# Patient Record
Sex: Male | Born: 1975 | Hispanic: No | Marital: Single | Smoking: Current some day smoker
Health system: Southern US, Community
[De-identification: ages and names within clinical notes are randomized; demographics above are authoritative.]

## PROBLEM LIST (undated history)

## (undated) DIAGNOSIS — E7800 Pure hypercholesterolemia, unspecified: Secondary | ICD-10-CM

## (undated) DIAGNOSIS — J45909 Unspecified asthma, uncomplicated: Secondary | ICD-10-CM

## (undated) DIAGNOSIS — I10 Essential (primary) hypertension: Secondary | ICD-10-CM

## (undated) HISTORY — DX: Unspecified asthma, uncomplicated: J45.909

---

## 1999-07-07 ENCOUNTER — Encounter: Payer: Self-pay | Admitting: Emergency Medicine

## 1999-07-07 ENCOUNTER — Emergency Department (HOSPITAL_COMMUNITY): Admission: EM | Admit: 1999-07-07 | Discharge: 1999-07-07 | Payer: Self-pay | Admitting: Emergency Medicine

## 2000-05-24 ENCOUNTER — Emergency Department (HOSPITAL_COMMUNITY): Admission: EM | Admit: 2000-05-24 | Discharge: 2000-05-24 | Payer: Self-pay | Admitting: Emergency Medicine

## 2012-12-05 ENCOUNTER — Encounter: Payer: Self-pay | Admitting: *Deleted

## 2013-07-04 ENCOUNTER — Emergency Department (HOSPITAL_COMMUNITY)
Admission: EM | Admit: 2013-07-04 | Discharge: 2013-07-04 | Disposition: A | Discharge status: Home / Self Care | Payer: BC Managed Care – PPO | Attending: Emergency Medicine | Admitting: Emergency Medicine

## 2013-07-04 ENCOUNTER — Encounter (HOSPITAL_COMMUNITY): Payer: Self-pay | Admitting: Emergency Medicine

## 2013-07-04 DIAGNOSIS — S0083XA Contusion of other part of head, initial encounter: Secondary | ICD-10-CM | POA: Diagnosis not present

## 2013-07-04 DIAGNOSIS — Y9289 Other specified places as the place of occurrence of the external cause: Secondary | ICD-10-CM | POA: Insufficient documentation

## 2013-07-04 DIAGNOSIS — Y939 Activity, unspecified: Secondary | ICD-10-CM | POA: Insufficient documentation

## 2013-07-04 DIAGNOSIS — S1093XA Contusion of unspecified part of neck, initial encounter: Principal | ICD-10-CM

## 2013-07-04 DIAGNOSIS — R51 Headache: Secondary | ICD-10-CM | POA: Insufficient documentation

## 2013-07-04 DIAGNOSIS — T148XXA Other injury of unspecified body region, initial encounter: Secondary | ICD-10-CM

## 2013-07-04 DIAGNOSIS — I10 Essential (primary) hypertension: Secondary | ICD-10-CM | POA: Insufficient documentation

## 2013-07-04 DIAGNOSIS — E7800 Pure hypercholesterolemia, unspecified: Secondary | ICD-10-CM | POA: Diagnosis not present

## 2013-07-04 DIAGNOSIS — S0093XA Contusion of unspecified part of head, initial encounter: Secondary | ICD-10-CM

## 2013-07-04 DIAGNOSIS — F17200 Nicotine dependence, unspecified, uncomplicated: Secondary | ICD-10-CM | POA: Diagnosis not present

## 2013-07-04 DIAGNOSIS — IMO0002 Reserved for concepts with insufficient information to code with codable children: Secondary | ICD-10-CM | POA: Diagnosis not present

## 2013-07-04 DIAGNOSIS — S0990XA Unspecified injury of head, initial encounter: Secondary | ICD-10-CM | POA: Diagnosis present

## 2013-07-04 DIAGNOSIS — S0003XA Contusion of scalp, initial encounter: Secondary | ICD-10-CM | POA: Diagnosis not present

## 2013-07-04 DIAGNOSIS — R42 Dizziness and giddiness: Secondary | ICD-10-CM | POA: Insufficient documentation

## 2013-07-04 HISTORY — DX: Essential (primary) hypertension: I10

## 2013-07-04 HISTORY — DX: Pure hypercholesterolemia, unspecified: E78.00

## 2013-07-04 MED ORDER — IBUPROFEN 400 MG PO TABS
800.0000 mg | ORAL_TABLET | Freq: Once | ORAL | Status: DC
  Filled 2013-07-04: qty 2

## 2013-07-04 MED ORDER — HYDROCODONE-ACETAMINOPHEN 5-325 MG PO TABS: 1.0000 | ORAL_TABLET | ORAL | Status: DC | PRN

## 2013-07-04 MED ORDER — TETANUS-DIPHTH-ACELL PERTUSSIS 5-2.5-18.5 LF-MCG/0.5 IM SUSP
0.5000 mL | Freq: Once | INTRAMUSCULAR | Status: DC
  Filled 2013-07-04: qty 0.5

## 2013-07-04 MED ORDER — IBUPROFEN 800 MG PO TABS: 800.0000 mg | ORAL_TABLET | Freq: Three times a day (TID) | ORAL | Status: DC

## 2013-07-04 NOTE — ED Notes (Signed)
Pt reports today he got hit in the head with a wrench, while working. No LOC, no blood thinners. Denies any blurry vision or dizziness. Pt is a x 4

## 2013-07-04 NOTE — Discharge Instructions (Signed)
Call for a follow up appointment with a Family or Primary Care Provider.  Return to the Emergency Department if your symptoms worsen.   Take medication as prescribed.  Ice your head 3-4 times a day for 15 minutes. Use your pain medication as prescribed and do not operate heavy machinery while on pain medication. Note that your pain medication contains acetaminophen (Tylenol) & its is not reccommended that you use additional acetaminophen (Tylenol) while taking this medication.    Emergency Department Resource Guide 1) Find a Doctor and Pay Out of Pocket Although you won't have to find out who is covered by your insurance plan, it is a good idea to ask around and get recommendations. You will then need to call the office and see if the doctor you have chosen will accept you as a new patient and what types of options they offer for patients who are self-pay. Some doctors offer discounts or will set up payment plans for their patients who do not have insurance, but you will need to ask so you aren't surprised when you get to your appointment.  2) Contact Your Local Health Department Not all health departments have doctors that can see patients for sick visits, but many do, so it is worth a call to see if yours does. If you don't know where your local health department is, you can check in your phone book. The CDC also has a tool to help you locate your state's health department, and many state websites also have listings of all of their local health departments.  3) Find a Walk-in Clinic If your illness is not likely to be very severe or complicated, you may want to try a walk in clinic. These are popping up all over the country in pharmacies, drugstores, and shopping centers. They're usually staffed by nurse practitioners or physician assistants that have been trained to treat common illnesses and complaints. They're usually fairly quick and inexpensive. However, if you have serious medical issues or  chronic medical problems, these are probably not your best option.  No Primary Care Doctor: - Call Health Connect at  [phone] - they can help you locate a primary care doctor that  accepts your insurance, provides certain services, etc. - Physician Referral Service- [phone]  Chronic Pain Problems: Organization         Address  Phone   Notes  [facility]  [phone] Patients need to be referred by their primary care doctor.   Medication Assistance: Organization         Address  Phone   Notes  [facility] Medication Dr. [facility] [address] [place], [place] [address] [phone] --Must be a resident of [facility] [place] -- Must have NO insurance coverage whatsoever (no Medicaid/ Medicare, etc.) -- The pt. MUST have a primary care doctor that directs their care regularly and follows them in the community   MedAssist  [phone]   [employer]  [phone]) [phone]    Agencies that provide inexpensive medical care: Organization         Address  Phone   Notes  [facility]  [phone]   [facility]    [phone]   [facility] [address] [place], [place] [address] [phone]   [facility] [address], [place] [phone]   Planned Parenthood    [phone]) [phone]   [facility]    [phone]  [phone]   [address], [place] Phone:  [phone], Fax:  [phone] Hours of Operation:  9 am - 6 pm, M-F.  Also accepts Medicaid/Medicare and self-pay.  [facility] [place], [address], [place] Phone: [phone], Fax: [phone]. Hours of Operation:  8:30 am - 5:30 pm, M-F.  Also accepts Medicaid and self-pay.  [facility] [place] [address] [place] Phone: [phone]   [place], [place], [place]  [phone], Ext. [phone] Mondays & Thursdays: 7-9 AM.  First 15 patients are seen on a first come, first serve basis.    [place] Providers:  Organization         Address  Phone   Notes  [facility] [address], [place] [phone] Also accepts self-pay patients.  [facility] [address], [place]  [phone]   [place], [address], [place] [phone]   [facility] [address] [place] [phone]   Lucianne Lei [address], [place]   [phone] Only accepts [place] Access [place] patients after they have their name applied to their card.   Self-Pay (no insurance) in [facility]:  Organization         Address  Phone   Notes  Sickle Cell Patients, [facility] [place] [phone]   [facility] [place] [phone]   [facility] [place]  [place], [place], [place] [phone]   [facility]/Dr. Osei-Bonsu  [address], [place] or [address], [place] [phone] Phone number for both [place] and [place] locations is the same.  [facility] [address] [place] [phone]   [facility] [address] [place] or [address] [phone]   [facility] [address] [place] [phone], phone; [phone], fax Sees patients 1st and 3rd Saturday of every month.  Must not qualify for public or private insurance (i.e. Medicaid, Medicare, [place] Health Choice, Veterans' Benefits)  Household income should be no more than 200% of the poverty level The clinic cannot treat you if you are pregnant or think you are pregnant  Sexually transmitted  diseases are not treated at the clinic.    Dental Care: Organization         Address  Phone  Notes  [facility] [phone] Accepts children up to age 10 who are enrolled in [place] or [place]; pregnant women with a Medicaid card; and children who have applied for Medicaid or [place] Health Choice, but were declined, whose parents can pay a reduced fee at time of service.  [facility]  [address], [place] [phone] Accepts children up to age 22 who are enrolled in [place] or [place]; pregnant women with a Medicaid card; and children who have applied for Medicaid or [place] Health Choice, but were declined, whose parents can pay a reduced fee at time of service.  [place]  Access PROGRAM  H. Rivera Colon [phone] Patients are seen by appointment only. Walk-ins are not accepted. [place] will see patients 57 years of age and older. Monday - Tuesday (8am-5pm) Most Wednesdays (8:30-5pm) $30 per visit, cash only  [facility] Dental Access PROGRAM  [address], [place] [facility] [phone] Patients are seen by appointment only. Walk-ins are not accepted. [place] will see patients 61 years of age and older. One Wednesday Evening (Monthly: Volunteer Based).  $30 per visit, cash only  [place]  [phone] for adults; Children under age 78, call Graduate Pediatric Dentistry at [phone]. Children aged 60-14, please call [phone] to request a pediatric application.  Dental services are provided in all areas of dental care including fillings, crowns and bridges, complete and partial dentures, implants, gum treatment, root canals, and extractions. Preventive care is also provided. Treatment is provided to both adults and children. Patients are selected via a  lottery and there is often a waiting list.   [facility] [address] [place]  [phone] [email or web address]   [facility] [address] [place], [place] [phone], Ext. [phone] Second and Fourth Thursday of each month, opens at 6:30 AM; Clinic ends at 9 AM.  Patients are seen on a first-come first-served basis, and a limited number are seen during each clinic.   [facility]  [address] Hillard Danker [place], [place] [phone]   Eligibility Requirements You must have lived in [place], [place], or [place] counties for at least the last three months.   You cannot be eligible for state or federal sponsored [employer], including [employer], [place], or [employer].   You generally cannot be eligible for healthcare insurance through your employer.    How to apply: Eligibility screenings are held every Tuesday and Wednesday afternoon from 1:00 pm until 4:00 pm. You do not need an appointment for the interview!  [facility] [address], [place], [place]   [place]  [place] Department  [place]  [phone]    Behavioral Health Resources in the Community: Intensive Outpatient Programs Organization         Address  Phone  Notes  [place] [address], [place], [place] [phone]   [facility] Outpatient [address], [place], [place]   ADS: Alcohol & Drug Svcs [address], [place], [place]   [place] [address]  [place], [place] or [phone]   Substance Abuse Resources Organization         Address  Phone  Notes  Alcohol and Drug Services  [phone]   [place]  [phone]   [facility] [place]   Chinita Pester  [phone]   Residential &  Outpatient Substance Abuse Program  [phone]   Psychological Services Organization         Address  Phone  Notes  [facility] [place]  [place]  [phone]   [place] [address] [place] or [phone]    Mobile Crisis Teams Organization         Address  Phone  Notes  [facility], Mobile Crisis Care Unit  [phone]   [facility]  [address] [place],  [place] [phone]   [facility] [address] [place] [phone]    Self-Help/Support Groups Organization         Address  Phone             Notes  [facility] of [place] - variety of support groups  336- [phone] Call for more information  Narcotics Anonymous (NA), Caring [facility] [address], [place]  2 meetings at this location   Statisticianesidential Treatment Programs Organization         Address  Phone  Notes  [facility] [address],    [place]  [phone]   [facility]  [address], [place] [phone], [place], [place] [phone]   [facility] [address], [place] [phone] Admissions: 8am-3pm M-F  [facility] [address]    [place], [place] [phone]   [facility] [address], [place], [place] [phone]   [facility] [address]  [place], [place] [phone]   [facility] - Intensive Outpatient [address], [place], [place] [phone]   [facility] ([facility] [address]  [place], [place] [phone] or [phone]   [facility] (RTS) [address] [place], [place] [phone] Accepts Medicaid  [facility] [address]  [place] [phone] Substance Abuse/Addiction Treatment   [facility] Behavioral Health Resources Organization          Address  Phone  Notes  [facility]  [phone](888) [phone]   Angie FavaJulie Brannon, PhD [address] [place], [place]   [phone] or [phone]) [phone]   [facility]   [address] [place], [place] [phone](336) [phone]   [facility] [address], [place], [place] [phone](336) [phone] Insurance/Medicaid/sponsorship through [facility]  Faith and Families [address]                                    [place], [place] [phone](336) [phone] Therapy/tele-psych/case  [facility] [address].   [place], [place] [phone]) [phone]    Dr. Lolly MustacheArfeen  [phone]   [facility]  United Way [facility] 1) [address], [place] 2) [address], [place] 3)  [address], [place] [phone](336) [phone]  [phone](336) [phone]   [facility] Child Abuse Hotline [phone](336) [phone] or [phone]) [phone] (After Hours)

## 2013-07-04 NOTE — ED Provider Notes (Signed)
CSN: [id number]     Arrival date & time 07/04/13  1540 History  This chart was scribed for non-physician practitioner Mellody DrownLauren Jazmene Racz working with Richardean Canalavid H Yao, MD by Elveria Risingimelie Horne, ED Scribe. This patient was seen in room TR04C/TR04C and the patient's care was started at 4:24 PM.   Chief Complaint  Patient presents with  . Head Injury     HPI Comments: Ricky Butler is a 38 y.o. male who presents to the Emergency Department with a head injury that occurred earlier today. Patient reports being hit in the head with a wrench while at work. Patient reports that the accident was witnessed. Patient denies LOC, but says that he did see "black."  The patient reports brief bilateral loss of vision to bilateral eyes.  Patient presents with swelling and an abrasion to his right forehead. Patient denies dizziness, light headedness, visual changes, numbness or tingling in extremties. Patient is able to ambulate without difficulty or lightheadedness. Patient is uncertain of his last Tetanus vaccination and is due for an update.  The history is provided by the patient. No language interpreter was used.    Past Medical History  Diagnosis Date  . Hypertension   . Hypercholesteremia    History reviewed. No pertinent past surgical history. No family history on file. History  Substance Use Topics  . Smoking status: Current Some Day Smoker  . Smokeless tobacco: Not on file  . Alcohol Use: Yes    Review of Systems  Constitutional: Negative for fever and chills.  Eyes: Negative for photophobia and visual disturbance.  Neurological: Positive for light-headedness and headaches. Negative for dizziness and numbness.  Psychiatric/Behavioral: Negative for confusion.      Allergies  Review of patient's allergies indicates no known allergies.  Home Medications   Prior to Admission medications   Not on File   Triage Vitals: BP 137/86  Pulse 73  Temp(Src) 98.1 F (36.7 C) (Oral)  Resp 14   SpO2 96% Physical Exam  Nursing note and vitals reviewed. Constitutional: He is oriented to person, place, and time. He appears well-developed and well-nourished.  Non-toxic appearance. He does not have a sickly appearance. He does not appear ill. No distress.  HENT:  Head: Normocephalic. Head is with abrasion and with contusion. Head is without raccoon's eyes and without Battle's sign.    Right Ear: Tympanic membrane normal. Tympanic membrane is not retracted and not bulging. No hemotympanum.  Left Ear: Tympanic membrane normal. Tympanic membrane is not retracted and not bulging. No hemotympanum.  Nose: Nose normal.  4 x 4 cm hematoma to right forehead.  Eyes: Conjunctivae and EOM are normal. Pupils are equal, round, and reactive to light. Right eye exhibits no discharge. Left eye exhibits no discharge. No scleral icterus.  Neck: Normal range of motion. Neck supple.  Pulmonary/Chest: Effort normal. No respiratory distress.  Musculoskeletal: Normal range of motion.  Neurological: He is alert and oriented to person, place, and time.  Speech is clear and goal oriented, follows commands Cranial nerves III - XII grossly intact, no facial droop Normal strength in upper and lower extremities bilaterally, strong and equal grip strength Sensation normal to light and sharp touch Moves all 4 extremities without ataxia, coordination intact   Skin: Skin is warm and dry. Abrasion noted. He is not diaphoretic.  Psychiatric: He has a normal mood and affect. His behavior is normal. Thought content normal.    ED Course  Procedures (including critical care time) COORDINATION OF CARE: 4:33 PM- Discussed  treatment plan with patient at bedside and patient agreed to plan.    MDM   Final diagnoses:  Head contusion  Abrasion   Patient presents after a wrench was thrown in his head, no LOC, no neurologic deficits on exam. He does have a hematoma and a small abrasion. Will update tetanus and treat  symptomatically. The patient has declined a tetanus shot during the ED. Discussed treatment plan with the patient. Return precautions given. Reports understanding and no other concerns at this time.  Patient is stable for discharge at this time.  Meds given in ED:  Medications  Tdap (BOOSTRIX) injection 0.5 mL (not administered)    Discharge Medication List as of 07/04/2013  4:52 PM    START taking these medications   Details  HYDROcodone-acetaminophen (NORCO/VICODIN) 5-325 MG per tablet Take 1 tablet by mouth every 4 (four) hours as needed., Starting 07/04/2013, Until Discontinued, Print    ibuprofen (ADVIL,MOTRIN) 800 MG tablet Take 1 tablet (800 mg total) by mouth 3 (three) times daily., Starting 07/04/2013, Until Discontinued, Print           Clabe SealLauren M Christinna Sprung, PA-C 07/06/13 [phone]

## 2013-07-04 NOTE — ED Notes (Addendum)
Per Pt with interpreter  For translation he had a tetanus injection 3 months.

## 2013-07-09 NOTE — ED Provider Notes (Signed)
Medical screening examination/treatment/procedure(s) were performed by non-physician practitioner and as supervising physician I was immediately available for consultation/collaboration.   EKG Interpretation None         David H Yao, MD 07/09/13 2258 

## 2017-01-16 ENCOUNTER — Encounter (HOSPITAL_COMMUNITY): Payer: Self-pay | Admitting: Emergency Medicine

## 2017-01-16 ENCOUNTER — Ambulatory Visit (HOSPITAL_COMMUNITY)
Admission: EM | Admit: 2017-01-16 | Discharge: 2017-01-16 | Disposition: A | Discharge status: Home / Self Care | Payer: BLUE CROSS/BLUE SHIELD | Attending: Family Medicine | Admitting: Family Medicine

## 2017-01-16 DIAGNOSIS — I10 Essential (primary) hypertension: Secondary | ICD-10-CM

## 2017-01-16 DIAGNOSIS — E782 Mixed hyperlipidemia: Secondary | ICD-10-CM | POA: Diagnosis not present

## 2017-01-16 MED ORDER — FENOFIBRIC ACID 45 MG PO CPDR: 45.0000 mg | DELAYED_RELEASE_CAPSULE | Freq: Every morning | ORAL | 1 refills | Status: DC

## 2017-01-16 MED ORDER — AMLODIPINE BESYLATE 5 MG PO TABS: 5.0000 mg | ORAL_TABLET | Freq: Every day | ORAL | 1 refills | Status: DC

## 2017-01-16 NOTE — ED Triage Notes (Signed)
PT C/O: constant HA on right side ... Also needing refill on BP meds.... Does not have PCP.... Blurred vision  BP today =165/107   ONSET: 1-2 months   TAKING MEDS: none   A&O x4... NAD... Ambulatory

## 2017-01-16 NOTE — ED Provider Notes (Signed)
[facility]   [id number] 01/16/17 Arrival Time: 1053   SUBJECTIVE:  Ricky Butler is a 41 y.o. male who presents to the urgent care with complaint of 2 months of constant HA on right side ... Also needing refill on BP meds.... Does not have PCP.... Blurred vision  BP today =165/107      He has a history of high blood pressure and in the past was taking lisinopril 20 mg daily.  He also has a history of elevated cholesterol and was taking fenofibric acid 45 mg daily.  His blood pressure was never really well controlled.  He works as Chartered certified accountantmachinist  Past Medical History:  Diagnosis Date  . Hypercholesteremia   . Hypertension    History reviewed. No pertinent family history. Social History   Socioeconomic History  . Marital status: Single    Spouse name: Not on file  . Number of children: Not on file  . Years of education: Not on file  . Highest education level: Not on file  Social Needs  . Financial resource strain: Not on file  . Food insecurity - worry: Not on file  . Food insecurity - inability: Not on file  . Transportation needs - medical: Not on file  . Transportation needs - non-medical: Not on file  Occupational History  . Not on file  Tobacco Use  . Smoking status: Current Some Day Smoker  . Smokeless tobacco: Never Used  Substance and Sexual Activity  . Alcohol use: Yes  . Drug use: No  . Sexual activity: Not on file  Other Topics Concern  . Not on file  Social History Narrative  . Not on file   Current Meds  Medication Sig  . [DISCONTINUED] Choline Fenofibrate (FENOFIBRIC ACID) 45 MG CPDR Take by mouth.  . [DISCONTINUED] lisinopril (PRINIVIL,ZESTRIL) 20 MG tablet Take 20 mg by mouth daily.   No Known Allergies    ROS: As per HPI, remainder of ROS negative.   OBJECTIVE:   Vitals:   01/16/17 1133  BP: (!) 165/107  Pulse: 73  Resp: 20  Temp: 98.5 F (36.9 C)  TempSrc: Oral  SpO2: 96%     General appearance: alert;  no distress Eyes: PERRL; EOMI; conjunctiva normal HENT: normocephalic; atraumatic; TMs normal, canal normal, external ears normal without trauma; nasal mucosa normal; oral mucosa normal Neck: supple Lungs: clear to auscultation bilaterally Heart: regular rate and rhythm Abdomen: soft, non-tender; bowel sounds normal; no masses or organomegaly; no guarding or rebound tenderness Back: no CVA tenderness Extremities: no cyanosis or edema; symmetrical with no gross deformities Skin: warm and dry Neurologic: normal gait; grossly normal Psychological: alert and cooperative; normal mood and affect      Labs:  No results found for this or any previous visit.  Labs Reviewed - No data to display  No results found.     ASSESSMENT & PLAN:  1. Essential hypertension   2. Mixed hyperlipidemia     Meds ordered this encounter  Medications  . Choline Fenofibrate (FENOFIBRIC ACID) 45 MG CPDR    Sig: Take 45 mg by mouth every morning.    Dispense:  30 capsule    Refill:  1  . amLODipine (NORVASC) 5 MG tablet    Sig: Take 1 tablet (5 mg total) by mouth daily.    Dispense:  30 tablet    Refill:  1    Reviewed expectations re: course of current medical issues. Questions answered. Outlined signs and symptoms indicating need for  more acute intervention. Patient verbalized understanding. After Visit Summary given.    Procedures:      Elvina SidleLauenstein, Marwan Lipe, MD 01/16/17 1149

## 2017-01-17 ENCOUNTER — Ambulatory Visit: Payer: PRIVATE HEALTH INSURANCE | Admitting: Internal Medicine

## 2017-01-17 ENCOUNTER — Telehealth: Payer: Self-pay | Admitting: General Practice

## 2017-01-17 NOTE — Telephone Encounter (Signed)
Noted  

## 2017-01-17 NOTE — Telephone Encounter (Signed)
Unable to take new pts

## 2017-01-17 NOTE — Telephone Encounter (Signed)
Patient not approved by Dr. Drue NovelPaz for appointment, (Dr. Drue NovelPaz not accepting new patient without prior approval. Patient was informed of appointment being cancel. Patient was called at 01/17/2017 @ 7:35 am. Patient stts he would call back. Adv patient we are not a walk in clinci or urgent care. Patient stts he understood all of the above. Will send to CMA for possible approval of NP.

## 2017-01-17 NOTE — Telephone Encounter (Signed)
Pt requesting to establish care- please advise.

## 2017-02-08 ENCOUNTER — Ambulatory Visit: Payer: BLUE CROSS/BLUE SHIELD | Admitting: Family Medicine

## 2017-02-08 ENCOUNTER — Other Ambulatory Visit: Payer: Self-pay

## 2017-02-08 ENCOUNTER — Encounter: Payer: Self-pay | Admitting: Family Medicine

## 2017-02-08 VITALS — BP 143/94 | HR 97 | Temp 97.5°F | Ht 62.6 in | Wt 178.8 lb

## 2017-02-08 DIAGNOSIS — R519 Headache, unspecified: Secondary | ICD-10-CM

## 2017-02-08 DIAGNOSIS — I10 Essential (primary) hypertension: Secondary | ICD-10-CM | POA: Diagnosis not present

## 2017-02-08 DIAGNOSIS — H538 Other visual disturbances: Secondary | ICD-10-CM

## 2017-02-08 DIAGNOSIS — E785 Hyperlipidemia, unspecified: Secondary | ICD-10-CM

## 2017-02-08 DIAGNOSIS — R51 Headache: Secondary | ICD-10-CM | POA: Diagnosis not present

## 2017-02-08 DIAGNOSIS — G8929 Other chronic pain: Secondary | ICD-10-CM

## 2017-02-08 LAB — POCT URINALYSIS DIP (MANUAL ENTRY)
Bilirubin, UA: NEGATIVE
Blood, UA: NEGATIVE
Glucose, UA: NEGATIVE mg/dL
Ketones, POC UA: NEGATIVE mg/dL
Leukocytes, UA: NEGATIVE
Nitrite, UA: NEGATIVE
Protein Ur, POC: NEGATIVE mg/dL
Spec Grav, UA: 1.015 (ref 1.010–1.025)
Urobilinogen, UA: 0.2 E.U./dL
pH, UA: 7.5 (ref 5.0–8.0)

## 2017-02-08 MED ORDER — AMLODIPINE BESYLATE 10 MG PO TABS: 10.0000 mg | ORAL_TABLET | Freq: Every day | ORAL | 5 refills | Status: DC

## 2017-02-08 MED ORDER — FENOFIBRIC ACID 45 MG PO CPDR: 45.0000 mg | DELAYED_RELEASE_CAPSULE | Freq: Every morning | ORAL | 5 refills | Status: DC

## 2017-02-08 NOTE — Progress Notes (Signed)
1/18/20192:12 PM  Ricky Butler [birth year], 42 y.o. male [id number]  Chief Complaint  Patient presents with  . Headache    looking to est care, having headaches for 2 month. Wants cholesterol ck    HPI:   Patient is a 42 y.o. male with past medical history significant for HTN and HLP who presents today to establish care.   He has not had a PCP in over a year He ran out of meds late 2018 Was seen in urgent care in dec due to headaches, his BP was elevated.  Started on amlodipine 5mg , normally was taking lisinopril 20mg , states that his BP is usually high  Sees ophtho yearly, last exam 2 months ago, normal per patient  Depression screen [facility] 2/9 02/08/2017  Decreased Interest 0  Down, Depressed, Hopeless 0  PHQ - 2 Score 0    No Known Allergies  Prior to Admission medications   Medication Sig Start Date End Date Taking? Authorizing Provider  amLODipine (NORVASC) 5 MG tablet Take 1 tablet (5 mg total) by mouth daily. 01/16/17  Yes Elvina Sidle, MD  Choline Fenofibrate (FENOFIBRIC ACID) 45 MG CPDR Take 45 mg by mouth every morning. 01/16/17  Yes Elvina Sidle, MD    Past Medical History:  Diagnosis Date  . Hypercholesteremia   . Hypertension     History reviewed. No pertinent surgical history.  Social History   Tobacco Use  . Smoking status: Current Some Day Smoker  . Smokeless tobacco: Never Used  Substance Use Topics  . Alcohol use: Yes    History reviewed. No pertinent family history.  Review of Systems  Constitutional: Negative for chills and fever.  Eyes: Positive for blurred vision. Negative for photophobia and pain.  Respiratory: Negative for cough and shortness of breath.   Cardiovascular: Negative for chest pain, palpitations and leg swelling.  Gastrointestinal: Negative for abdominal pain, nausea and vomiting.  Genitourinary: Negative for dysuria.  Neurological: Positive for headaches. Negative for sensory change, speech change  and focal weakness.     OBJECTIVE:  Blood pressure (!) 143/94, pulse 97, temperature (!) 97.5 F (36.4 C), temperature source Oral, height 5' 2.6" (1.59 m), weight 178 lb 12.8 oz (81.1 kg), SpO2 96 %.   Visual Acuity Screening   Right eye Left eye Both eyes  Without correction: 20/40 20/25 20/25   With correction:       Physical Exam  Constitutional: He is oriented to person, place, and time and well-developed, well-nourished, and in no distress.  HENT:  Head: Normocephalic and atraumatic.  Right Ear: Hearing, tympanic membrane, external ear and ear canal normal.  Left Ear: Hearing, tympanic membrane, external ear and ear canal normal.  Mouth/Throat: Oropharynx is clear and moist. No oropharyngeal exudate.  Eyes: Conjunctivae and EOM are normal. Pupils are equal, round, and reactive to light.  Neck: Neck supple. No thyromegaly present.  Cardiovascular: Normal rate, regular rhythm, normal heart sounds and intact distal pulses. Exam reveals no gallop and no friction rub.  No murmur heard. Pulmonary/Chest: Effort normal and breath sounds normal. He has no wheezes. He has no rales.  Abdominal: Soft. Bowel sounds are normal. He exhibits no distension and no mass. There is no tenderness.  Musculoskeletal: Normal range of motion. He exhibits no edema.  Lymphadenopathy:    He has no cervical adenopathy.  Neurological: He is alert and oriented to person, place, and time. He has normal strength and normal reflexes. No cranial nerve deficit. Gait normal.  Skin: Skin is warm  and dry.  Psychiatric: Mood and affect normal.    Results for orders placed or performed in visit on 02/08/17 (from the past 24 hour(s))  POCT urinalysis dipstick     Status: None   Collection Time: 02/08/17  2:35 PM  Result Value Ref Range   Color, UA yellow yellow   Clarity, UA clear clear   Glucose, UA negative negative mg/dL   Bilirubin, UA negative negative   Ketones, POC UA negative negative mg/dL   Spec  Grav, UA 1.6101.015 1.010 - 1.025   Blood, UA negative negative   pH, UA 7.5 5.0 - 8.0   Protein Ur, POC negative negative mg/dL   Urobilinogen, UA 0.2 0.2 or 1.0 E.U./dL   Nitrite, UA Negative Negative   Leukocytes, UA Negative Negative    ASSESSMENT and PLAN  1. Essential hypertension, benign BP above goal, increase amlodipine, labs per below, ER precautions given.  - CBC - Comprehensive metabolic panel - TSH - POCT urinalysis dipstick  2. Hyperlipidemia, unspecified hyperlipidemia type - Comprehensive metabolic panel - TSH - Lipid panel  3. Chronic nonintractable headache, unspecified headache type Normal neuro exam. Re-evaluate at next visit, once BP back in range.   4. Blurry vision, right eye Per our vision screen, decreased vision in right eye. Advised patient to see ophtho again.   Other orders - amLODipine (NORVASC) 10 MG tablet; Take 1 tablet (10 mg total) by mouth daily. - Choline Fenofibrate (FENOFIBRIC ACID) 45 MG CPDR; Take 45 mg by mouth every morning.  Return in about 4 weeks (around 03/08/2017).    Myles LippsIrma M Santiago, MD Primary Care at [facility] [address] [place], [place] [id number] Ph.  [phone] Fax [phone]

## 2017-02-08 NOTE — Patient Instructions (Signed)
     IF you received an x-ray today, you will receive an invoice from [facility]. Please contact [facility] at [phone] with questions or concerns regarding your invoice.   IF you received labwork today, you will receive an invoice from [facility]. Please contact [facility] at [phone] with questions or concerns regarding your invoice.   Our billing staff will not be able to assist you with questions regarding bills from these companies.  You will be contacted with the lab results as soon as they are available. The fastest way to get your results is to activate your My Chart account. Instructions are located on the last page of this paperwork. If you have not heard from us regarding the results in 2 weeks, please contact this office.        IF you received an x-ray today, you will receive an invoice from [facility]. Please contact [facility] at [phone] with questions or concerns regarding your invoice.   IF you received labwork today, you will receive an invoice from [facility]. Please contact [facility] at [phone] with questions or concerns regarding your invoice.   Our billing staff will not be able to assist you with questions regarding bills from these companies.  You will be contacted with the lab results as soon as they are available. The fastest way to get your results is to activate your My Chart account. Instructions are located on the last page of this paperwork. If you have not heard from us regarding the results in 2 weeks, please contact this office.     

## 2017-02-11 LAB — CBC
Hematocrit: 43.8 % (ref 37.5–51.0)
Hemoglobin: 15.3 g/dL (ref 13.0–17.7)
MCH: 29.1 pg (ref 26.6–33.0)
MCHC: 34.9 g/dL (ref 31.5–35.7)
MCV: 83 fL (ref 79–97)
Platelets: 408 10*3/uL — ABNORMAL HIGH (ref 150–379)
RBC: 5.26 x10E6/uL (ref 4.14–5.80)
RDW: 13.8 % (ref 12.3–15.4)
WBC: 8.9 10*3/uL (ref 3.4–10.8)

## 2017-02-11 LAB — COMPREHENSIVE METABOLIC PANEL
ALT: 29 IU/L (ref 0–44)
AST: 19 IU/L (ref 0–40)
Albumin/Globulin Ratio: 1.7 (ref 1.2–2.2)
Albumin: 4.8 g/dL (ref 3.5–5.5)
Alkaline Phosphatase: 67 IU/L (ref 39–117)
BUN/Creatinine Ratio: 20 (ref 9–20)
BUN: 18 mg/dL (ref 6–24)
Bilirubin Total: 0.3 mg/dL (ref 0.0–1.2)
CO2: 21 mmol/L (ref 20–29)
Calcium: 9.4 mg/dL (ref 8.7–10.2)
Chloride: 106 mmol/L (ref 96–106)
Creatinine, Ser: 0.89 mg/dL (ref 0.76–1.27)
GFR calc Af Amer: 124 mL/min/{1.73_m2} (ref >59)
GFR calc non Af Amer: 107 mL/min/{1.73_m2} (ref >59)
Globulin, Total: 2.8 g/dL (ref 1.5–4.5)
Glucose: 88 mg/dL (ref 65–99)
Potassium: 4.2 mmol/L (ref 3.5–5.2)
Sodium: 144 mmol/L (ref 134–144)
Total Protein: 7.6 g/dL (ref 6.0–8.5)

## 2017-02-11 LAB — LIPID PANEL
Chol/HDL Ratio: 6.7 ratio — ABNORMAL HIGH (ref 0.0–5.0)
Cholesterol, Total: 201 mg/dL — ABNORMAL HIGH (ref 100–199)
HDL: 30 mg/dL — ABNORMAL LOW (ref >39)
LDL Calculated: 114 mg/dL — ABNORMAL HIGH (ref 0–99)
Triglycerides: 283 mg/dL — ABNORMAL HIGH (ref 0–149)
VLDL Cholesterol Cal: 57 mg/dL — ABNORMAL HIGH (ref 5–40)

## 2017-02-11 LAB — TSH: TSH: 1.64 u[IU]/mL (ref 0.450–4.500)

## 2017-02-15 ENCOUNTER — Encounter: Payer: Self-pay | Admitting: Family Medicine

## 2017-02-18 ENCOUNTER — Telehealth: Payer: Self-pay | Admitting: Family Medicine

## 2017-02-18 NOTE — Telephone Encounter (Signed)
Copied from CRM [phone]. Topic: Quick Communication - See Telephone Encounter >> Feb 18, 2017  3:18 PM Guinevere FerrariMorris, Alexavier Tsutsui E, NT wrote: CRM for notification. See Telephone encounter for: Patient called and wanted to talk to the doctor about his amLODipine (NORVASC) 10 MG tablet. Patient said this medication is giving him a slight head headache and a little pain in the middle of his neck. Patient said this pain is not severe but would like a call back.   02/18/17.

## 2017-02-19 NOTE — Telephone Encounter (Signed)
Phone message forwarded to Dr.Santiago RE: Amlodipine causing headache, etc.

## 2017-02-20 ENCOUNTER — Encounter: Payer: Self-pay | Admitting: Family Medicine

## 2017-02-22 NOTE — Telephone Encounter (Signed)
Please call patient back, you will need an interpreter. I want him to continue with amlodipine. He was having headaches for 2 months before this medication was started. Have him come see me in 1-2 weeks. thanks

## 2017-02-22 NOTE — Telephone Encounter (Signed)
Rafiella interpreter [phone] left detailed message on patient's voice mail with MD recommendations.

## 2017-03-15 ENCOUNTER — Encounter: Payer: Self-pay | Admitting: Family Medicine

## 2017-03-15 ENCOUNTER — Ambulatory Visit: Payer: BLUE CROSS/BLUE SHIELD | Admitting: Family Medicine

## 2017-03-15 ENCOUNTER — Other Ambulatory Visit: Payer: Self-pay

## 2017-03-15 VITALS — BP 140/98 | HR 81 | Temp 98.4°F | Resp 16 | Ht 62.21 in | Wt 180.0 lb

## 2017-03-15 DIAGNOSIS — I10 Essential (primary) hypertension: Secondary | ICD-10-CM | POA: Diagnosis not present

## 2017-03-15 DIAGNOSIS — R0683 Snoring: Secondary | ICD-10-CM | POA: Diagnosis not present

## 2017-03-15 DIAGNOSIS — G4489 Other headache syndrome: Secondary | ICD-10-CM

## 2017-03-15 MED ORDER — TOPIRAMATE 25 MG PO CPSP: 25.0000 mg | ORAL_CAPSULE | Freq: Two times a day (BID) | ORAL | 1 refills | Status: AC

## 2017-03-15 MED ORDER — LISINOPRIL-HYDROCHLOROTHIAZIDE 20-25 MG PO TABS: 1.0000 | ORAL_TABLET | Freq: Every day | ORAL | 1 refills | Status: DC

## 2017-03-15 NOTE — Progress Notes (Signed)
2/22/201911:29 AM  Ricky Butler 09/30/1975, 42 y.o. male [id number]  Chief Complaint  Patient presents with  . Hypertension    f/u from 01/18 visit for HTN.  Thinks Norvasc is giving HAs and cervical tension.     HPI:   Patient is a 42 y.o. male with past medical history significant for HTN and hypertriclydemia who presents today for followup on HTN  He continues to have headaches that he believes have been made worse since he started amlodipine, despite headaches starting before he was on amlodipine. He would like to get back on lisinopril.  Headaches: new onset about 3 months ago, they start in occiput and radiate forward to behind right eye, he feels he sees black spots at times, no photophobia, sometimes very mild nausea. Feels headache gets better with ibuprofen but it seems to always come back, he has headaches every day, sometimes very intense and throbbing, sometimes dull and achy. He denies any neck pain.   Snoring: he snores, at times his daughter has noticed him gasping for air, he has started to drink coffee because he is waking up and feels tired most of the day  [facility] 2/9 03/15/2017 02/08/2017  Decreased Interest 0 0  Down, Depressed, Hopeless 0 0  PHQ - 2 Score 0 0    No Known Allergies  Prior to Admission medications   Medication Sig Start Date End Date Taking? Authorizing Provider  omeprazole (PRILOSEC OTC) 20 MG tablet Take 20 mg by mouth daily.   Yes [provider]  amLODipine (NORVASC) 10 MG tablet Take 1 tablet (10 mg total) by mouth daily. 02/08/17   Myles LippsSantiago, Camey Edell M, MD  Choline Fenofibrate (FENOFIBRIC ACID) 45 MG CPDR Take 45 mg by mouth every morning. 02/08/17   Myles LippsSantiago, Dolly Harbach M, MD    Past Medical History:  Diagnosis Date  . Hypercholesteremia   . Hypertension     History reviewed. No pertinent surgical history.  Social History   Tobacco Use  . Smoking status: Current Some Day Smoker  . Smokeless  tobacco: Never Used  Substance Use Topics  . Alcohol use: Yes    History reviewed. No pertinent family history.  Review of Systems  Constitutional: Negative for chills and fever.  HENT: Negative for ear pain, hearing loss and tinnitus.   Eyes: Negative for photophobia and pain.  Respiratory: Negative for cough and shortness of breath.   Cardiovascular: Negative for chest pain, palpitations and leg swelling.  Gastrointestinal: Negative for abdominal pain, nausea and vomiting.  Musculoskeletal: Negative for neck pain.  Neurological: Positive for headaches. Negative for dizziness, sensory change, speech change, focal weakness and seizures.     OBJECTIVE:  Blood pressure (!) 140/98, pulse 81, temperature 98.4 F (36.9 C), temperature source Oral, resp. rate 16, height 5' 2.21" (1.58 m), weight 180 lb (81.6 kg), SpO2 98 %.  Physical Exam  Constitutional: He is oriented to person, place, and time and well-developed, well-nourished, and in no distress.  HENT:  Head: Normocephalic and atraumatic.  Mouth/Throat: Oropharynx is clear and moist.  Eyes: EOM are normal. Pupils are equal, round, and reactive to light.  Neck: Full passive range of motion without pain. Neck supple. No spinous process tenderness and no muscular tenderness present.  Cardiovascular: Normal rate, regular rhythm and normal pulses. Exam reveals no gallop and no friction rub.  No murmur heard. TA nontender and pulsatile  Pulmonary/Chest: Effort normal and breath sounds normal. He has no wheezes. He has no rales.  Neurological: He is alert and oriented to person, place, and time. He has normal strength, normal reflexes and intact cranial nerves. He has a normal Cerebellar Exam. Gait normal.  Skin: Skin is warm and dry.    ASSESSMENT and PLAN  1. Essential hypertension, benign Discussed that amlodipine is most likely not affecting headaches but given normal renal function, will return to lisinopril, adding hctz for  improved control - Ambulatory referral to Neurology  2. Snoring Concerning for OSA, which can also be contributing to HTN and headaches - Ambulatory referral to Neurology  3. Other headache syndrome Given new onset of headaches will r/o structural issues/mass. Suggestive of migraines. Starting topamax. Discussed new medication r/se/b. Cont with low dose ibuprofen prn for headahce.  - CT Head Wo Contrast; Future - Ambulatory referral to Neurology  Other orders - omeprazole (PRILOSEC OTC) 20 MG tablet; Take 20 mg by mouth daily. - lisinopril-hydrochlorothiazide (PRINZIDE,ZESTORETIC) 20-25 MG tablet; Take 1 tablet by mouth daily. - topiramate (TOPAMAX) 25 MG capsule; Take 1-2 capsules (25-50 mg total) by mouth 2 (two) times daily.  Return in about 6 weeks (around 04/26/2017).    Myles Lipps, MD Primary Care at [facility] [address] [place], [place] [address] Ph.  [phone] Fax [phone]

## 2017-04-11 ENCOUNTER — Ambulatory Visit
Admission: RE | Admit: 2017-04-11 | Discharge: 2017-04-11 | Disposition: A | Discharge status: Home / Self Care | Payer: BLUE CROSS/BLUE SHIELD | Source: Ambulatory Visit | Attending: Family Medicine | Admitting: Family Medicine

## 2017-04-11 DIAGNOSIS — G4489 Other headache syndrome: Secondary | ICD-10-CM

## 2017-04-24 ENCOUNTER — Ambulatory Visit (INDEPENDENT_AMBULATORY_CARE_PROVIDER_SITE_OTHER): Payer: BLUE CROSS/BLUE SHIELD | Admitting: Neurology

## 2017-04-24 ENCOUNTER — Encounter: Payer: Self-pay | Admitting: Neurology

## 2017-04-24 VITALS — BP 123/83 | HR 76 | Ht 62.0 in | Wt 173.0 lb

## 2017-04-24 DIAGNOSIS — R51 Headache: Secondary | ICD-10-CM

## 2017-04-24 DIAGNOSIS — I10 Essential (primary) hypertension: Secondary | ICD-10-CM | POA: Insufficient documentation

## 2017-04-24 DIAGNOSIS — Z9189 Other specified personal risk factors, not elsewhere classified: Secondary | ICD-10-CM | POA: Diagnosis not present

## 2017-04-24 DIAGNOSIS — R519 Headache, unspecified: Secondary | ICD-10-CM | POA: Insufficient documentation

## 2017-04-24 DIAGNOSIS — R0683 Snoring: Secondary | ICD-10-CM | POA: Insufficient documentation

## 2017-04-24 NOTE — Patient Instructions (Signed)
Apnea del sueo (Sleep Apnea) La apnea del sueo es una afeccin en la que la respiracin se detiene o se hace superficial durante el sueo. Los episodios de apnea del sueo suelen durar 10 segundos o ms, y pueden ocurrir hasta 20 veces por hora. La apnea del sueo interrumpe el sueo y evita que el cuerpo descanse como lo necesita. Esta afeccin puede aumentar el riesgo de sufrir ciertos problemas de salud, como los siguientes:  Infarto de miocardio.  Ictus.  Obesidad.  Diabetes.  Insuficiencia cardaca.  Latidos cardacos irregulares. Existen tres tipos de apnea del sueo:  Tiene apnea obstructiva del sueo. Este tipo de apnea ocurre cuando las vas respiratorias se obstruyen o colapsan.  Apnea central del sueo. Este tipo ocurre cuando la parte del cerebro que controla la respiracin no enva las seales correctas a los msculos que controlan la respiracin.  Apnea mixta del sueo. Esta es una combinacin de apnea mixta y central del sueo. CAUSAS La causa ms frecuente de esta afeccin es la obstruccin o el colapso de las vas respiratorias. Las vas respiratorias pueden colapsar o bloquearse en los siguientes casos:  Los msculos de la garganta estn anormalmente relajados.  La lengua y las amgdalas son ms grandes que lo normal.  Tiene sobrepeso.  Las vas respiratorias son ms pequeas que lo normal. FACTORES DE RIESGO Es ms probable que esta afeccin se manifieste en las personas que:  Tienen sobrepeso.  Fuman.  Tienen vas respiratorias ms pequeas que lo normal.  Son ancianos.  Son hombres.  Bebe alcohol.  Toman sedantes o tranquilizantes.  Tienen antecedentes familiares de apnea del sueo. SNTOMAS Los sntomas de esta afeccin incluyen lo siguiente:  Dificultad para quedarse dormido.  Somnolencia y cansancio durante el da.  Irritabilidad.  Ronquidos fuertes.  Dolores de cabeza matutinos.  Dificultad para  concentrarse.  Olvidos.  Disminucin del inters por el sexo.  Somnolencia sin motivo aparente.  Cambios en el estado de nimo.  Cambios en la personalidad.  Sentimientos de depresin.  Levantarse con frecuencia durante la noche para orinar.  [place].  Dolor de garganta. DIAGNSTICO Esta afeccin se puede diagnosticar a travs de lo siguiente:  Los antecedentes mdicos.  Un examen fsico.  Una serie de pruebas que se hacen mientras la persona duerme (estudio del sueo). Estas pruebas generalmente se hacen en un laboratorio del sueo, pero tambin pueden hacerse en el hogar. TRATAMIENTO El tratamiento de esta afeccin tiene como objetivo restablecer la respiracin normal y aliviar los sntomas durante el sueo. Puede implicar controlar los problemas de salud que pueden afectar la respiracin, como la hipertensin arterial o la obesidad. El tratamiento puede incluir lo siguiente:  Dormir de costado.  Si tiene congestin nasal, usar un descongestionante.  Evitar el consumo de depresores, como el alcohol, sedantes y opiceos.  Si tiene sobrepeso, bajar de peso.  Realizar cambios en la dieta.  Dejar de fumar.  Usar un dispositivo para abrir las vas respiratorias mientras duerme; por ejemplo: ? Un aparato bucal. Se trata de una boquilla hecha a medida que desplaza la mandbula hacia adelante. ? Un dispositivo de presin positiva y continua de las vas respiratorias (PPC). Este dispositivo suministra oxgeno a las vas respiratorias a travs de una mscara. ? Un dispositivo de presin espiratoria nasal (EPAP) de las vas respiratorias Este dispositivo tiene vlvulas que se colocan en cada fosa nasal. ? Un dispositivo de presin positiva de dos niveles (BPAP) de las vas respiratorias. Este dispositivo suministra oxgeno a las vas respiratorias   a travs de una mscara.  Someterse a ciruga si los dems tratamientos no resultan eficaces. Durante la ciruga, el exceso de  tejido se elimina para aumentar el ancho de las vas respiratorias. Realizar un tratamiento para la apnea del sueo es importante. Sin el tratamiento adecuado, esta afeccin puede causar lo siguiente:  Hipertensin arterial.  Enfermedad arterial coronaria.  Incapacidad (del hombre) para alcanzar o mantener una ereccin (impotencia).  Reduccin de las habilidades de pensamiento. INSTRUCCIONES PARA EL CUIDADO EN EL HOGAR  Haga cambios en su estilo de vida segn las recomendaciones de su mdico.  Consuma una dieta sana y bien equilibrada.  Tome los medicamentos de venta libre y los recetados solamente como se lo haya indicado el mdico.  Evite el uso de depresores, como el alcohol, sedantes y narcticos.  Si tiene sobrepeso, tome medidas para bajar de peso.  Si le proporcionaron un dispositivo para abrir las vas respiratorias mientras duerme, selo solamente como se lo haya indicado el mdico.  No consuma ningn producto que contenga tabaco, lo que incluye cigarrillos, tabaco de mascar y cigarrillos electrnicos. Si necesita ayuda para dejar de fumar, consulte al mdico.  Concurra a todas las visitas de control como se lo haya indicado el mdico. Esto es importante. SOLICITE ATENCIN MDICA SI:  El dispositivo que recibi para abrir las vas respiratorias durante el sueo es incmodo o no parece funcionar.  Los sntomas no mejoran.  Los sntomas empeoran. SOLICITE ATENCIN MDICA DE INMEDIATO SI:  Siente dolor en el pecho.  Le falta el aire.  Desarrolla malestar en la espalda, los brazos o el estmago.  Tiene dificultad para hablar.  Siente debilidad o adormecimiento en un lado del cuerpo.  Tiene parlisis facial. Estos sntomas pueden representar un problema grave que constituye una emergencia. No espere hasta que los sntomas desaparezcan. Solicite atencin mdica de inmediato. Comunquese con el servicio de emergencias de su localidad (911 en los [place]). No  conduzca por sus propios medios hasta el hospital. Esta informacin no tiene como fin reemplazar el consejo del mdico. Asegrese de hacerle al mdico cualquier pregunta que tenga. Document Released: 10/18/2004 Document Revised: 05/02/2015 Document Reviewed: 10/18/2014 Elsevier Interactive Patient Education  2018 [employer]  

## 2017-04-24 NOTE — Progress Notes (Signed)
SLEEP MEDICINE CLINIC   Provider:  Melvyn Novas, [place]  Primary Care Physician:  Myles Lipps, MD   Referring Provider: Myles Lipps, MD    Chief Complaint  Patient presents with  . New Patient (Initial Visit)    pt is spanish speaking and with daughter and interpreter, pt snores in sleep and stops breathing in sleep and gasps for air. pt complains of fatigue in the daytime.     HPI:  Ricky Butler is a 42 y.o. spanish speaking  male patient , seen here in a referral  from Dr. Leretha Pol for a sleep evaluation. The patient was last seen by his primary care physician Dr. Koren Shiver on 15 March 2017 for he is followed up for hypertension, he was started on Norvasc but noticed headaches and cervical tension, so he made significant lifestyle changes and dietary changes to help him control his hypertension and cholesterol levels.  He did not endorse any kind of depression symptoms, he does have headaches sometimes they start in the back of the head and radiate forwards they also seem to apply a pressure to the right eye, this retro-orbital pressure is new and the onset was only in December 2018, he has no photophobia but sometimes mild nausea with his headaches.  The headaches improved with ibuprofen but do not stay off long they always return.  He had stated in February that he has headaches daily sometimes very intense and throbbing sometimes dull and achy.  He did not have neck pain or neck stiffness.    He also reported that he snores and his daughter has witnessed some apneic spells.   Sleep habits are as follows: he does endorse 8 PM as his bedtime, the bedroom is cool, quiet and dark. Shares the bedroom with his wife, who has stated he moves a lot in his sleep.  He watches TV in bed , but switches it off at 8 PM.  Mr. Ginny Forth also endorses that most of the days he has trouble going to sleep promptly and it may take him longer than 30 minutes to fall asleep.  He  can sleep finally through until 5 AM when he has to leave for work.  He estimates that he can get 7 hours of nocturnal sleep on average.  He reports that he dreams but he cannot really recall his dreams and they are not nightmarish in character.  He does not have bathroom breaks every night and if so only once. He has had several months now where he on many mornings he wakes up with a headache, but the headache is not what wakes him. He does not have cardiac related chest pains, he does not endorse palpitations or diaphoresis, nausea or headaches that would wake him from sleep, sometimes he wakes up and has some palpitation or fasciculation in the right shoulder and chest muscles. He naps in daytime - weekends, may sleep for 30 minutes. He feels refreshed after those.    Sleep medical history and family sleep history: no history of OSA in his family . Mother has insomnia, snoring.  Social history:  Married , employed, from [place] , smokes 1-2 cig on WE, brought his daughter with him, drinks on weekends, caffeine- coffee in AM 1-cup a day.   Review of Systems: Out of a complete 14 system review, the patient complains of only the following symptoms, and all other reviewed systems are negative.   Topiramate for migraine prophylaxis.  Snoring.  Epworth  score 1/ 24 , Fatigue severity score 24  , depression score n/a   Social History   Socioeconomic History  . Marital status: Single    Spouse name: Not on file  . Number of children: Not on file  . Years of education: Not on file  . Highest education level: Not on file  Occupational History  . Not on file  Social Needs  . Financial resource strain: Not on file  . Food insecurity:    Worry: Not on file    Inability: Not on file  . Transportation needs:    Medical: Not on file    Non-medical: Not on file  Tobacco Use  . Smoking status: Current Some Day Smoker  . Smokeless tobacco: Never Used  . Tobacco comment: social smoker  Substance  and Sexual Activity  . Alcohol use: Yes    Comment: occasional   . Drug use: No  . Sexual activity: Not on file  Lifestyle  . Physical activity:    Days per week: Not on file    Minutes per session: Not on file  . Stress: Not on file  Relationships  . Social connections:    Talks on phone: Not on file    Gets together: Not on file    Attends religious service: Not on file    Active member of club or organization: Not on file    Attends meetings of clubs or organizations: Not on file    Relationship status: Not on file  . Intimate partner violence:    Fear of current or ex partner: Not on file    Emotionally abused: Not on file    Physically abused: Not on file    Forced sexual activity: Not on file  Other Topics Concern  . Not on file  Social History Narrative  . Not on file    Family History  Problem Relation Age of Onset  . Hypertension Mother   . Hypercholesterolemia Mother     Past Medical History:  Diagnosis Date  . Asthma   . Hypercholesteremia   . Hypertension     No past surgical history on file.  Current Outpatient Medications  Medication Sig Dispense Refill  . Choline Fenofibrate (FENOFIBRIC ACID) 45 MG CPDR Take 45 mg by mouth every morning. 30 capsule 5  . lisinopril-hydrochlorothiazide (PRINZIDE,ZESTORETIC) 20-25 MG tablet Take 1 tablet by mouth daily. 90 tablet 1  . omega-3 acid ethyl esters (LOVAZA) 1 g capsule Take 1 g by mouth daily.    Marland Kitchen omeprazole (PRILOSEC OTC) 20 MG tablet Take 20 mg by mouth daily.    Marland Kitchen topiramate (TOPAMAX) 25 MG capsule Take 1-2 capsules (25-50 mg total) by mouth 2 (two) times daily. 60 capsule 1   No current facility-administered medications for this visit.     Allergies as of 04/24/2017  . (No Known Allergies)    Vitals: BP 123/83   Pulse 76   Ht 5\' 2"  (1.575 m)   Wt 173 lb (78.5 kg)   BMI 31.64 kg/m  Last Weight:  Wt Readings from Last 1 Encounters:  04/24/17 173 lb (78.5 kg)   ZOX:WRUE mass index is 31.64  kg/m.     Last Height:   Ht Readings from Last 1 Encounters:  04/24/17 5\' 2"  (1.575 m)    Physical exam:  General: The patient is awake, alert and appears not in acute distress. The patient is well groomed. Head: Normocephalic, atraumatic. Neck is supple. Mallampati 3,  neck circumference:18.5 .  Nasal airflow patent , Retrognathia is seen.  Cardiovascular:  Regular rate and rhythm, without  murmurs or carotid bruit, and without distended neck veins. Respiratory: Lungs are clear to auscultation. Skin:  Without evidence of edema, or rash Trunk: BMI is 32. The patient's posture is erect   Neurologic exam : The patient is awake and alert, oriented to place and time.  Mood and affect are appropriate.  Cranial nerves: Pupils are equal and briskly reactive to light. Funduscopic exam without evidence of pallor or edema.  Extraocular movements  in vertical and horizontal planes intact and without nystagmus. Visual fields by finger perimetry are intact. Hearing to finger rub intact.   Facial sensation intact to fine touch.  Facial motor strength is symmetric and tongue and uvula move midline. Shoulder shrug was symmetrical.   Motor exam:  Normal tone, muscle bulk and symmetric strength in all extremities. Sensory:  Fine touch, pinprick and vibration were tested in all extremities. Proprioception tested in the upper extremities was normal. Coordination: Rapid alternating movements in the fingers/hands was normal. Finger-to-nose maneuver normal without evidence of ataxia, dysmetria or tremor. Gait and station: Patient walks without assistive device and is able unassisted to climb up to the exam table. Strength within normal limits. Stance is stable and normal.   Deep tendon reflexes: in the  upper and lower extremities are symmetric and intact. Babinski maneuver response is downgoing.    Mr. 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  His blood pressure this morning is very well controlled but he still has the neck pain and I wonder if it is related to his physical activity.  Sometimes he will wake up feeling numb in his fingertips with pain and needle dysesthesias.  This could relate to a nerve impingement from the cervical spine. As to his risk of sleep apnea he is indeed at higher risk because of his anatomy is upper airway is narrow, he does have mild red prognathia, A thick neck and an elevated body mass index.  He seems to prefer to breathe through his mouth.  I will order a home sleep test For him asking him to wear the device at home for at least 6 hours and return at the next business day for a download.  This will be a screening test for the presence of obstructive sleep apnea and will also tell me if his heart rate is very irregular overnight which may give us additional clues about why he has headaches. I will order a home sleep test today But will inform Mr. Ginny ForthRenteria that likely the test cannot be done before 24 th April.  Assessment:  After physical and neurologic examination, review of laboratory studies,  Personal review of imaging studies, reports of other /same  Imaging studies, results of polysomnography and / or neurophysiology testing and pre-existing records as far as provided in visit., my assessment is   1)OSA, snoring, high risk  2) HTN and headaches.  3) shoulder and neck tension, hand numbness.     The patient was advised of the nature of the diagnosed disorder , the treatment options and the  risks for general health and wellness arising from not treating the condition.   I spent more than 50 minutes of face to face time with the patient.  Greater than 50% of time was spent in counseling and coordination of  care. We have discussed the diagnosis and differential and I answered the patient's questions.    Plan:  Treatment  plan and additional workup :   HST .    No orders of the defined types were placed in this encounter.    No orders of the defined types were placed in this encounter.    No follow-ups on file.   Melvyn Novas, MD 04/24/2017, 10:23 AM  Certified in Neurology by ABPN Certified in Sleep Medicine by [facility] [address], [address] [place], [place] [address]

## 2017-04-26 ENCOUNTER — Ambulatory Visit: Payer: BLUE CROSS/BLUE SHIELD | Admitting: Family Medicine

## 2017-05-03 ENCOUNTER — Encounter: Payer: Self-pay | Admitting: Family Medicine

## 2017-05-03 ENCOUNTER — Other Ambulatory Visit: Payer: Self-pay

## 2017-05-03 ENCOUNTER — Ambulatory Visit: Payer: BLUE CROSS/BLUE SHIELD | Admitting: Family Medicine

## 2017-05-03 VITALS — BP 110/70 | HR 70 | Temp 98.2°F | Ht 61.81 in | Wt 170.8 lb

## 2017-05-03 DIAGNOSIS — R0683 Snoring: Secondary | ICD-10-CM | POA: Diagnosis not present

## 2017-05-03 DIAGNOSIS — I10 Essential (primary) hypertension: Secondary | ICD-10-CM

## 2017-05-03 DIAGNOSIS — G4489 Other headache syndrome: Secondary | ICD-10-CM

## 2017-05-03 DIAGNOSIS — Z125 Encounter for screening for malignant neoplasm of prostate: Secondary | ICD-10-CM

## 2017-05-03 DIAGNOSIS — E785 Hyperlipidemia, unspecified: Secondary | ICD-10-CM

## 2017-05-03 NOTE — Patient Instructions (Signed)
     IF you received an x-ray today, you will receive an invoice from [facility]. Please contact [facility] at [phone] with questions or concerns regarding your invoice.   IF you received labwork today, you will receive an invoice from [facility]. Please contact [facility] at [phone] with questions or concerns regarding your invoice.   Our billing staff will not be able to assist you with questions regarding bills from these companies.  You will be contacted with the lab results as soon as they are available. The fastest way to get your results is to activate your My Chart account. Instructions are located on the last page of this paperwork. If you have not heard from us regarding the results in 2 weeks, please contact this office.     

## 2017-05-03 NOTE — Progress Notes (Signed)
4/12/20199:59 AM  Arliss Journey Nov 18, 1975, 42 y.o. male [id number]  Chief Complaint  Patient presents with  . Follow-up    folow up for snoring, also wanting to have cholestorol and prostate lab work done    HPI:   Patient is a 42 y.o. male with past medical history significant for HTN, snoring and headaches who presents today for followup  He is tolerating amlodipine well Working on weight loss and diet Headaches much improved, stopped taking Topamax about 3 weeks ago Blurry vision improved Reports improved energy Overall doing better He would like to have cholesterol rechecked and would like prostate cancer screening  Saw neuro 04/24/17 - ordered home sleep study, after April 24th  Depression screen [facility] 2/9 05/03/2017 03/15/2017 02/08/2017  Decreased Interest 0 0 0  Down, Depressed, Hopeless 0 0 0  PHQ - 2 Score 0 0 0    No Known Allergies  Prior to Admission medications   Medication Sig Start Date End Date Taking? Authorizing Provider  Choline Fenofibrate (FENOFIBRIC ACID) 45 MG CPDR Take 45 mg by mouth every morning. 02/08/17  Yes Myles Lipps, MD  lisinopril-hydrochlorothiazide (PRINZIDE,ZESTORETIC) 20-25 MG tablet Take 1 tablet by mouth daily. 03/15/17  Yes Myles Lipps, MD  omega-3 acid ethyl esters (LOVAZA) 1 g capsule Take 1 g by mouth daily.   Yes [provider]  omeprazole (PRILOSEC OTC) 20 MG tablet Take 20 mg by mouth daily.   Yes [provider]    Past Medical History:  Diagnosis Date  . Asthma   . Hypercholesteremia   . Hypertension     History reviewed. No pertinent surgical history.  Social History   Tobacco Use  . Smoking status: Current Some Day Smoker  . Smokeless tobacco: Never Used  . Tobacco comment: social smoker  Substance Use Topics  . Alcohol use: Yes    Comment: occasional     Family History  Problem Relation Age of Onset  . Hypertension Mother   . Hypercholesterolemia Mother      Review of Systems  Constitutional: Negative for chills and fever.  Respiratory: Negative for cough and shortness of breath.   Cardiovascular: Negative for chest pain, palpitations and leg swelling.  Gastrointestinal: Negative for abdominal pain, nausea and vomiting.     OBJECTIVE:  Blood pressure 110/70, pulse 70, temperature 98.2 F (36.8 C), temperature source Oral, height 5' 1.81" (1.57 m), weight 170 lb 12.8 oz (77.5 kg), SpO2 98 %.  Wt Readings from Last 3 Encounters:  05/03/17 170 lb 12.8 oz (77.5 kg)  04/24/17 173 lb (78.5 kg)  03/15/17 180 lb (81.6 kg)   Physical Exam  Constitutional: He is oriented to person, place, and time.  HENT:  Head: Normocephalic and atraumatic.  Mouth/Throat: Oropharynx is clear and moist.  Eyes: Pupils are equal, round, and reactive to light. EOM are normal.  Neck: Neck supple.  Cardiovascular: Normal rate and regular rhythm. Exam reveals no gallop and no friction rub.  No murmur heard. Pulmonary/Chest: Effort normal and breath sounds normal. He has no wheezes. He has no rales.  Neurological: He is alert and oriented to person, place, and time.  Skin: Skin is warm and dry.    ASSESSMENT and PLAN  1. Essential hypertension, benign At goal, congratulated on LFM, continue current medication  2. Hyperlipidemia, unspecified hyperlipidemia type Rechecking today, cont with LFM - Lipid panel  3. Screening for prostate cancer - PSA  4. Snoring Scheduled for home sleep study  5. Other  headache syndrome Much improved, off topomax, etiology still unclear, will continue to monitor clinically  Return in about 3 months (around 08/02/2017).    Myles LippsIrma M Santiago, MD [facility] [address] [place], [place] [address] Ph.  [phone] Fax [phone]

## 2017-05-04 LAB — LIPID PANEL
Chol/HDL Ratio: 5.6 ratio — ABNORMAL HIGH (ref 0.0–5.0)
Cholesterol, Total: 195 mg/dL (ref 100–199)
HDL: 35 mg/dL — ABNORMAL LOW (ref >39)
LDL Calculated: 113 mg/dL — ABNORMAL HIGH (ref 0–99)
Triglycerides: 234 mg/dL — ABNORMAL HIGH (ref 0–149)
VLDL Cholesterol Cal: 47 mg/dL — ABNORMAL HIGH (ref 5–40)

## 2017-05-04 LAB — PSA: Prostate Specific Ag, Serum: 1 ng/mL (ref 0.0–4.0)

## 2017-05-13 ENCOUNTER — Encounter: Payer: Self-pay | Admitting: *Deleted

## 2017-05-15 ENCOUNTER — Telehealth: Payer: Self-pay

## 2017-05-15 NOTE — Telephone Encounter (Signed)
We have attempted to call the patient two times to schedule sleep study.  Patient has been unavailable at the phone numbers we have on file and has not returned our calls.  At this point we will send a letter asking patient to please contact the sleep lab to schedule their sleep study.  If patient calls back we will schedule them for their sleep study. 

## 2017-10-12 ENCOUNTER — Other Ambulatory Visit: Payer: Self-pay | Admitting: Family Medicine

## 2017-10-22 ENCOUNTER — Other Ambulatory Visit: Payer: Self-pay | Admitting: Family Medicine

## 2017-10-23 NOTE — Telephone Encounter (Signed)
Requested Prescriptions  Pending Prescriptions Disp Refills  . Choline Fenofibrate (FENOFIBRIC ACID) 45 MG CPDR [Pharmacy Med Name: FENOFIBRIC '45MG'$  DR CAPSULES] 90 capsule 0    Sig: TAKE ONE CAPSULE BY MOUTH EVERY MORNING     Cardiovascular:  Antilipid - Fibric Acid Derivatives Failed - 10/22/2017  4:45 PM      Failed - LDL in normal range and within 360 days    LDL Calculated  Date Value Ref Range Status  05/03/2017 113 (H) 0 - 99 mg/dL Final         Failed - HDL in normal range and within 360 days    HDL  Date Value Ref Range Status  05/03/2017 35 (L) >39 mg/dL Final         Failed - Triglycerides in normal range and within 360 days    Triglycerides  Date Value Ref Range Status  05/03/2017 234 (H) 0 - 149 mg/dL Final         Failed - ALT in normal range and within 180 days    ALT  Date Value Ref Range Status  02/08/2017 29 0 - 44 IU/L Final         Failed - AST in normal range and within 180 days    AST  Date Value Ref Range Status  02/08/2017 19 0 - 40 IU/L Final         Failed - Cr in normal range and within 180 days    Creatinine, Ser  Date Value Ref Range Status  02/08/2017 0.89 0.76 - 1.27 mg/dL Final         Failed - eGFR in normal range and within 180 days    GFR calc Af Amer  Date Value Ref Range Status  02/08/2017 124 >59 mL/min/1.73 Final   GFR calc non Af Amer  Date Value Ref Range Status  02/08/2017 107 >59 mL/min/1.73 Final         Passed - Total Cholesterol in normal range and within 360 days    Cholesterol, Total  Date Value Ref Range Status  05/03/2017 195 100 - 199 mg/dL Final         Passed - Valid encounter within last 12 months    Recent Outpatient Visits          5 months ago Essential hypertension, benign   [facility], Lilia Argue, MD   7 months ago Essential hypertension, benign   [facility], Lilia Argue, MD   8 months ago Essential hypertension, benign   [facility], Lilia Argue,  MD

## 2018-02-04 ENCOUNTER — Other Ambulatory Visit: Payer: Self-pay | Admitting: Family Medicine

## 2018-02-04 NOTE — Telephone Encounter (Signed)
Requested medication (s) are due for refill today: Yes  Requested medication (s) are on the active medication list: Yes  Last refill:  10/2017  Future visit scheduled: No  Notes to clinic:  Unable to refill per protocol     Requested Prescriptions  Pending Prescriptions Disp Refills   lisinopril-hydrochlorothiazide (PRINZIDE,ZESTORETIC) 20-25 MG tablet [Pharmacy Med Name: LISINOPRIL-HCTZ 20/25MG  TABLETS] 90 tablet 0    Sig: TAKE 1 TABLET BY MOUTH DAILY     Cardiovascular:  ACEI + Diuretic Combos Failed - 02/04/2018  3:40 PM      Failed - Na in normal range and within 180 days    Sodium  Date Value Ref Range Status  02/08/2017 144 134 - 144 mmol/L Final         Failed - K in normal range and within 180 days    Potassium  Date Value Ref Range Status  02/08/2017 4.2 3.5 - 5.2 mmol/L Final         Failed - Cr in normal range and within 180 days    Creatinine, Ser  Date Value Ref Range Status  02/08/2017 0.89 0.76 - 1.27 mg/dL Final         Failed - Ca in normal range and within 180 days    Calcium  Date Value Ref Range Status  02/08/2017 9.4 8.7 - 10.2 mg/dL Final         Failed - Valid encounter within last 6 months    Recent Outpatient Visits          9 months ago Essential hypertension, benign   [facility], Meda Coffee, MD   10 months ago Essential hypertension, benign   [facility], Meda Coffee, MD   12 months ago Essential hypertension, benign   [facility], Meda Coffee, MD             Passed - Patient is not pregnant      Passed - Last BP in normal range    BP Readings from Last 1 Encounters:  05/03/17 110/70

## 2018-02-04 NOTE — Telephone Encounter (Signed)
Patient called and advised he will need to schedule an appointment, he says he will call back tomorrow, he is at work right now.

## 2018-02-06 NOTE — Telephone Encounter (Signed)
No further refills without office visit 

## 2018-09-24 IMAGING — CT CT HEAD W/O CM
1 series · 16 of 30 positions shown, 20 images · non-contrast
Comparison: None.

CLINICAL DATA: Right-sided headache and vertigo.

EXAM:
CT HEAD WITHOUT CONTRAST
TECHNIQUE: Contiguous axial images were obtained from the base of the skull
through the vertex without intravenous contrast.

[Series 2: head w/(date) · axial · 0.51mm/px · z∈[+514,+654]mm · 16 of 32 slices shown, 20 images]
[im 2/32  brain]
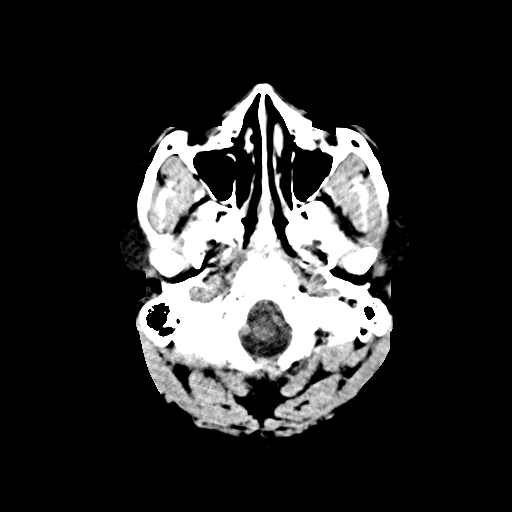
[im 2/32  bone]
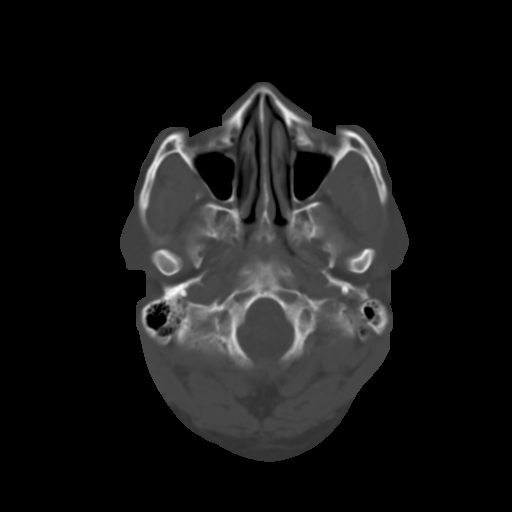
[im 4/32  brain]
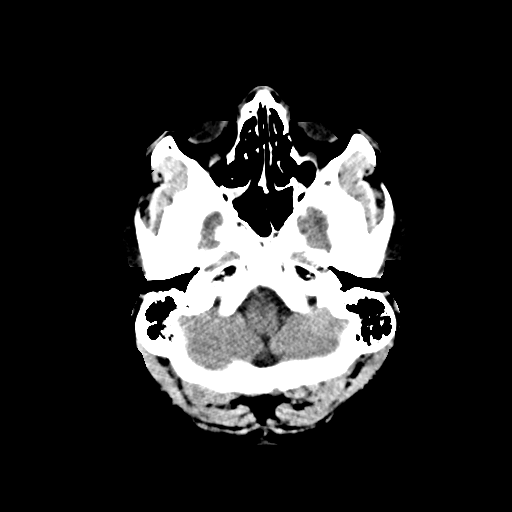
[im 6/32  brain]
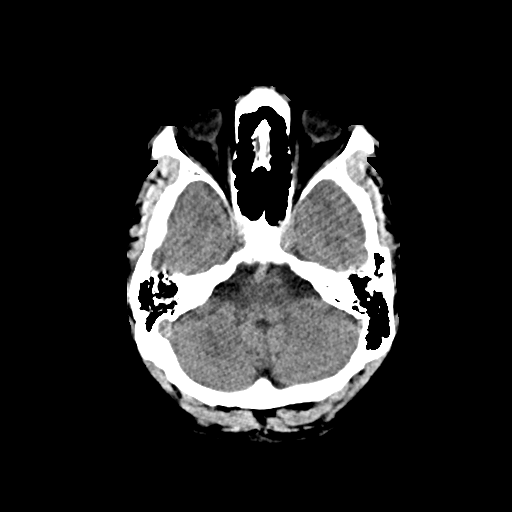
[im 8/32  brain]
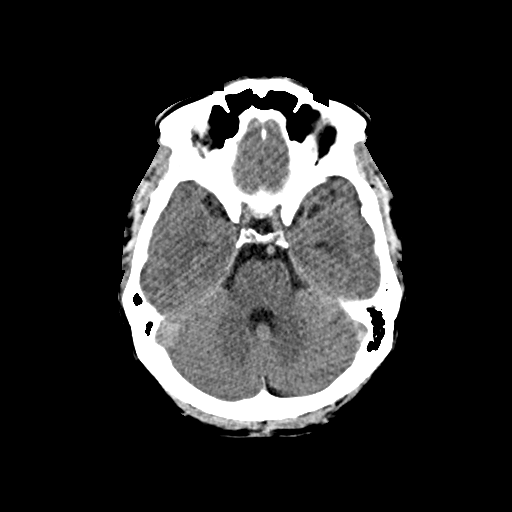
[im 9/32  brain]
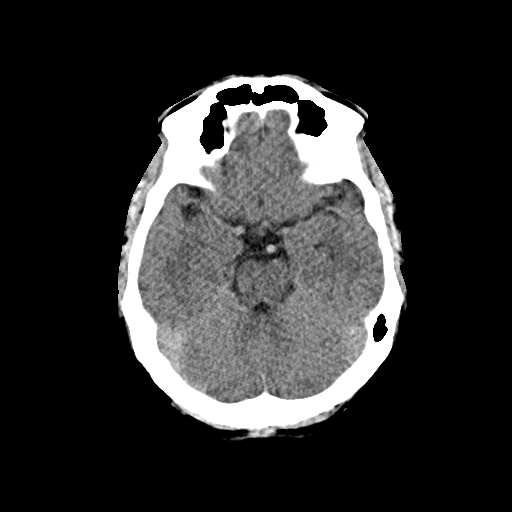
[im 9/32  bone]
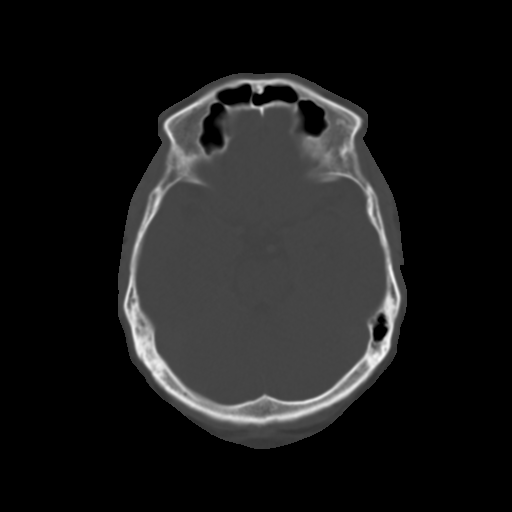
[im 11/32  brain]
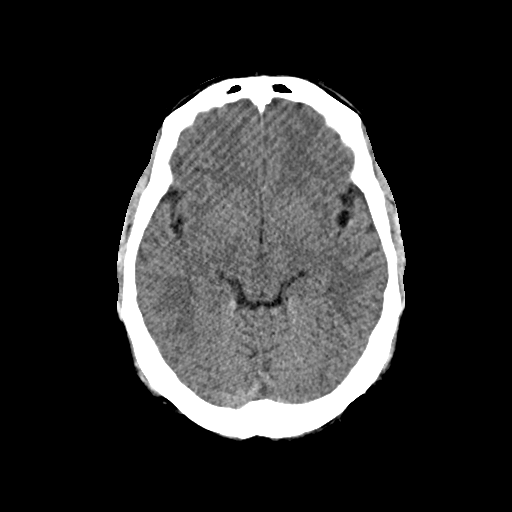
[im 13/32  brain]
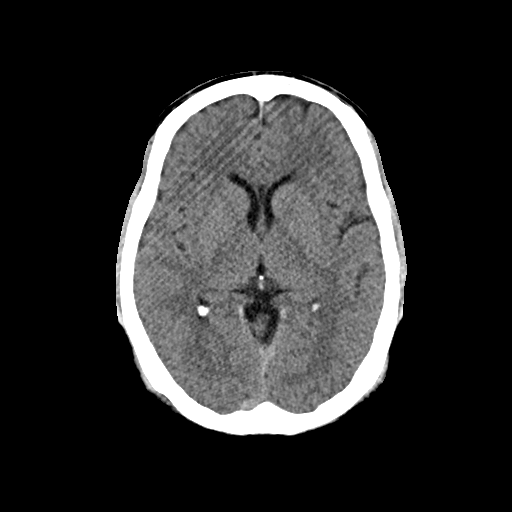
[im 15/32  brain]
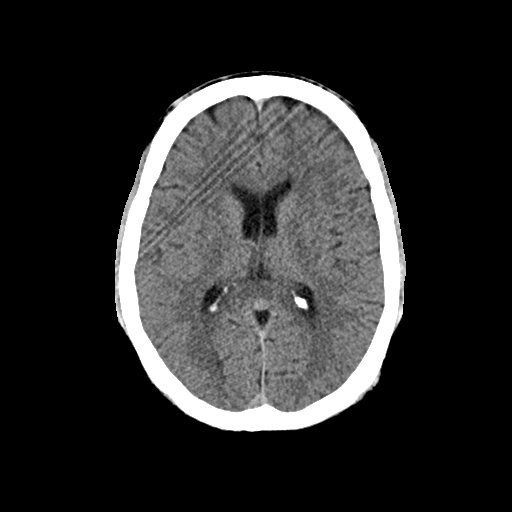
[im 17/32  brain]
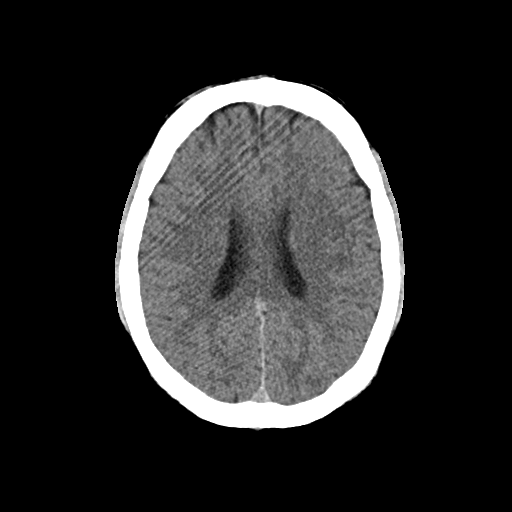
[im 17/32  bone]
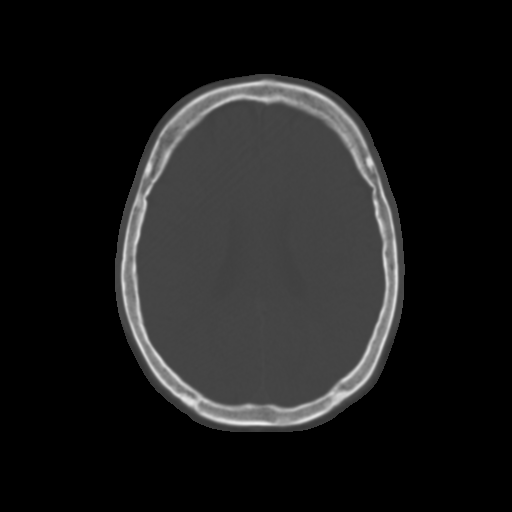
[im 19/32  brain]
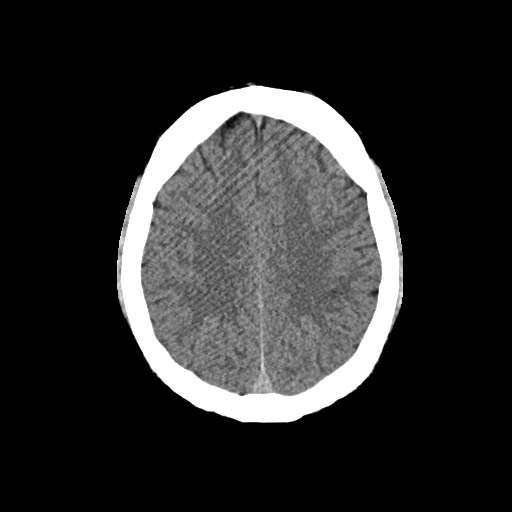
[im 21/32  brain]
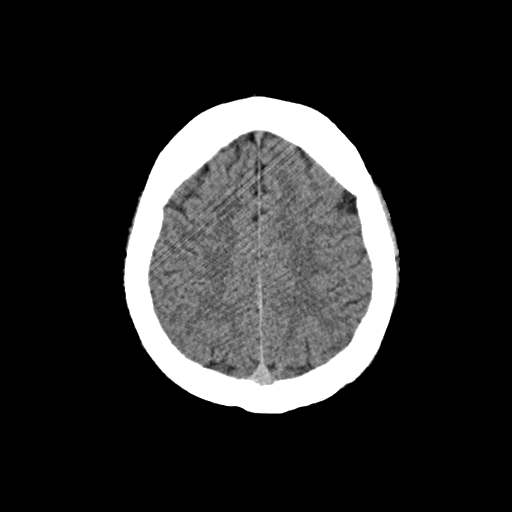
[im 23/32  brain]
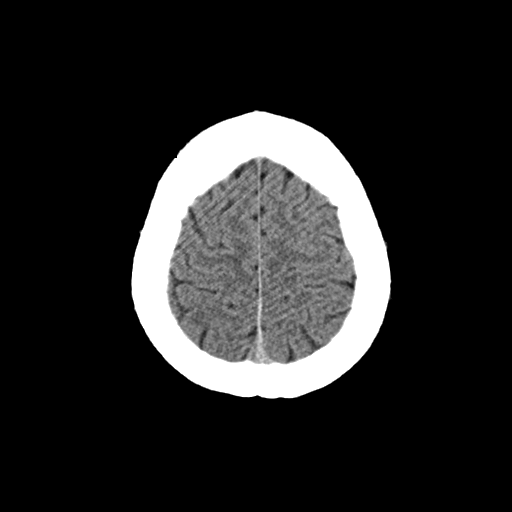
[im 24/32  brain]
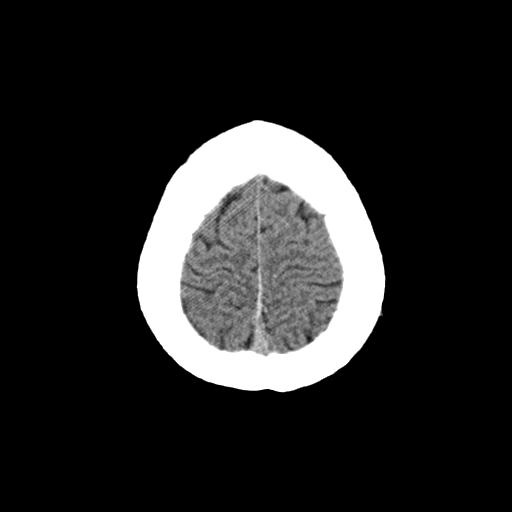
[im 24/32  bone]
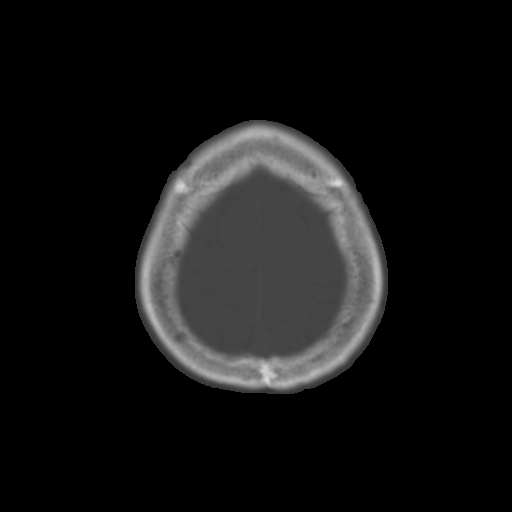
[im 26/32  brain]
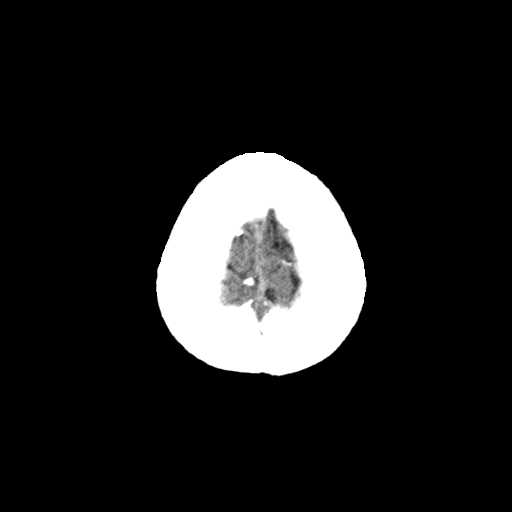
[im 28/32  brain]
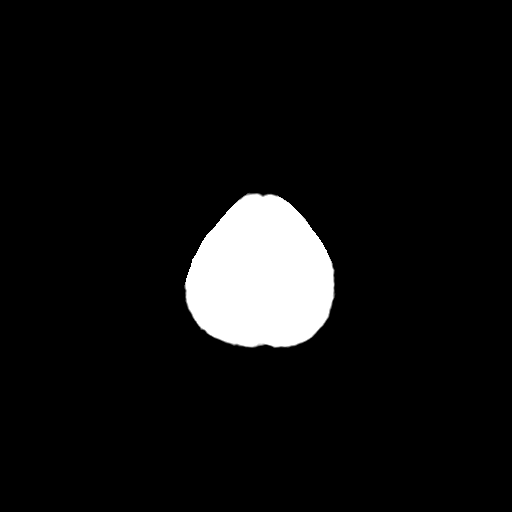
[im 30/32  brain]
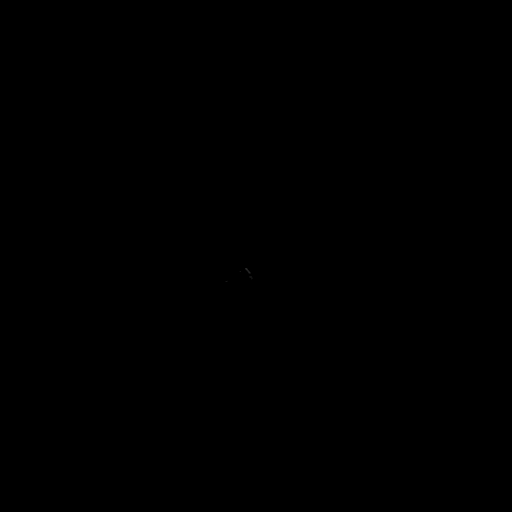

[16 of 30 positions shown; findings below may reference images not displayed]

FINDINGS: Brain: No evidence of acute infarction, hemorrhage, hydrocephalus,
extra-axial collection or mass lesion/mass effect.

Vascular: No hyperdense vessel or unexpected calcification.

Skull: Normal. Negative for fracture or focal lesion.

Sinuses/Orbits: No acute finding.

Other: None.
IMPRESSION: No acute intracranial abnormality.
